# Patient Record
Sex: Male | Born: 1995 | Race: Black or African American | Hispanic: No | Marital: Single | Smoking: Former smoker
Health system: Southern US, Community
[De-identification: ages and names within clinical notes are randomized; demographics above are authoritative.]

---
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
## 2015-06-01 ENCOUNTER — Emergency Department (HOSPITAL_COMMUNITY)
Admission: EM | Admit: 2015-06-01 | Discharge: 2015-06-01 | Disposition: A | Discharge status: Home / Self Care | Payer: Self-pay | Attending: Emergency Medicine | Admitting: Emergency Medicine

## 2015-06-01 ENCOUNTER — Encounter (HOSPITAL_COMMUNITY): Payer: Self-pay | Admitting: Emergency Medicine

## 2015-06-01 DIAGNOSIS — L0390 Cellulitis, unspecified: Secondary | ICD-10-CM

## 2015-06-01 DIAGNOSIS — Z23 Encounter for immunization: Secondary | ICD-10-CM | POA: Insufficient documentation

## 2015-06-01 DIAGNOSIS — H6001 Abscess of right external ear: Secondary | ICD-10-CM | POA: Insufficient documentation

## 2015-06-01 DIAGNOSIS — H6011 Cellulitis of right external ear: Secondary | ICD-10-CM | POA: Insufficient documentation

## 2015-06-01 DIAGNOSIS — L919 Hypertrophic disorder of the skin, unspecified: Secondary | ICD-10-CM | POA: Insufficient documentation

## 2015-06-01 DIAGNOSIS — F17210 Nicotine dependence, cigarettes, uncomplicated: Secondary | ICD-10-CM | POA: Insufficient documentation

## 2015-06-01 DIAGNOSIS — L0291 Cutaneous abscess, unspecified: Secondary | ICD-10-CM

## 2015-06-01 MED ORDER — CEPHALEXIN 500 MG PO CAPS: 500.0000 mg | ORAL_CAPSULE | Freq: Four times a day (QID) | ORAL | Status: AC

## 2015-06-01 MED ORDER — SULFAMETHOXAZOLE-TRIMETHOPRIM 800-160 MG PO TABS: 1.0000 | ORAL_TABLET | Freq: Two times a day (BID) | ORAL | Status: AC

## 2015-06-01 MED ORDER — IBUPROFEN 200 MG PO TABS
600.0000 mg | ORAL_TABLET | Freq: Once | ORAL | Status: AC
  Administered 2015-06-01: 600 mg via ORAL
  Filled 2015-06-01: qty 1

## 2015-06-01 MED ORDER — IBUPROFEN 800 MG PO TABS: 800.0000 mg | ORAL_TABLET | Freq: Three times a day (TID) | ORAL | Status: AC | PRN

## 2015-06-01 MED ORDER — HYDROCODONE-ACETAMINOPHEN 5-325 MG PO TABS: 1.0000 | ORAL_TABLET | ORAL | Status: AC | PRN

## 2015-06-01 MED ORDER — TETANUS-DIPHTH-ACELL PERTUSSIS 5-2.5-18.5 LF-MCG/0.5 IM SUSP
0.5000 mL | Freq: Once | INTRAMUSCULAR | Status: AC
  Administered 2015-06-01: 0.5 mL via INTRAMUSCULAR
  Filled 2015-06-01: qty 0.5

## 2015-06-01 MED ORDER — LIDOCAINE HCL (PF) 1 % IJ SOLN
5.0000 mL | Freq: Once | INTRAMUSCULAR | Status: AC
  Administered 2015-06-01: 5 mL
  Filled 2015-06-01: qty 5

## 2015-06-01 NOTE — Discharge Instructions (Signed)
Read the information below.  Use the prescribed medication as directed.  Please discuss all new medications with your pharmacist.  Do not take additional tylenol while taking the prescribed pain medication to avoid overdose.  You may return to the Emergency Department at any time for worsening condition or any new symptoms that concern you.    If you develop increased redness, swelling, uncontrolled pain, or fevers greater than 100.4, return to the ER immediately for a recheck.     Incision and Drainage Incision and drainage is a procedure in which a sac-like structure (cystic structure) is opened and drained. The area to be drained usually contains material such as pus, fluid, or blood.  LET YOUR CAREGIVER KNOW ABOUT:   Allergies to medicine.  Medicines taken, including vitamins, herbs, eyedrops, over-the-counter medicines, and creams.  Use of steroids (by mouth or creams).  Previous problems with anesthetics or numbing medicines.  History of bleeding problems or blood clots.  Previous surgery.  Other health problems, including diabetes and kidney problems.  Possibility of pregnancy, if this applies. RISKS AND COMPLICATIONS  Pain.  Bleeding.  Scarring.  Infection. BEFORE THE PROCEDURE  You may need to have an ultrasound or other imaging tests to see how large or deep your cystic structure is. Blood tests may also be used to determine if you have an infection or how severe the infection is. You may need to have a tetanus shot. PROCEDURE  The affected area is cleaned with a cleaning fluid. The cyst area will then be numbed with a medicine (local anesthetic). A small incision will be made in the cystic structure. A syringe or catheter may be used to drain the contents of the cystic structure, or the contents may be squeezed out. The area will then be flushed with a cleansing solution. After cleansing the area, it is often gently packed with a gauze or another wound dressing. Once it  is packed, it will be covered with gauze and tape or some other type of wound dressing. AFTER THE PROCEDURE   Often, you will be allowed to go home right after the procedure.  You may be given antibiotic medicine to prevent or heal an infection.  If the area was packed with gauze or some other wound dressing, you will likely need to come back in 1 to 2 days to get it removed.  The area should heal in about 14 days.   This information is not intended to replace advice given to you by your health care provider. Make sure you discuss any questions you have with your health care provider.   Document Released: 07/28/2000 Document Revised: 08/03/2011 Document Reviewed: 03/29/2011 Elsevier Interactive Patient Education 2016 [employer]  Abscess An abscess is an infected area that contains a collection of pus and debris.It can occur in almost any part of the body. An abscess is also known as a furuncle or boil. CAUSES  An abscess occurs when tissue gets infected. This can occur from blockage of oil or sweat glands, infection of hair follicles, or a minor injury to the skin. As the body tries to fight the infection, pus collects in the area and creates pressure under the skin. This pressure causes pain. People with weakened immune systems have difficulty fighting infections and get certain abscesses more often.  SYMPTOMS Usually an abscess develops on the skin and becomes a painful mass that is red, warm, and tender. If the abscess forms under the skin, you may feel a moveable soft  area under the skin. Some abscesses break open (rupture) on their own, but most will continue to get worse without care. The infection can spread deeper into the body and eventually into the bloodstream, causing you to feel ill.  °DIAGNOSIS  °Your caregiver will take your medical history and perform a physical exam. A sample of fluid may also be taken from the abscess to determine what is causing your  infection. °TREATMENT  °Your caregiver may prescribe antibiotic medicines to fight the infection. However, taking antibiotics alone usually does not cure an abscess. Your caregiver may need to make a small cut (incision) in the abscess to drain the pus. In some cases, gauze is packed into the abscess to reduce pain and to continue draining the area. °HOME CARE INSTRUCTIONS  °· Only take over-the-counter or prescription medicines for pain, discomfort, or fever as directed by your caregiver. °· If you were prescribed antibiotics, take them as directed. Finish them even if you start to feel better. °· If gauze is used, follow your caregiver's directions for changing the gauze. °· To avoid spreading the infection: °· Keep your draining abscess covered with a bandage. °· Wash your hands well. °· Do not share personal care items, towels, or whirlpools with others. °· Avoid skin contact with others. °· Keep your skin and clothes clean around the abscess. °· Keep all follow-up appointments as directed by your caregiver. °SEEK MEDICAL CARE IF:  °· You have increased pain, swelling, redness, fluid drainage, or bleeding. °· You have muscle aches, chills, or a general ill feeling. °· You have a fever. °MAKE SURE YOU:  °· Understand these instructions. °· Will watch your condition. °· Will get help right away if you are not doing well or get worse. °  °This information is not intended to replace advice given to you by your health care provider. Make sure you discuss any questions you have with your health care provider. °  °Document Released: 11/11/2004 Document Revised: 08/03/2011 Document Reviewed: 04/16/2011 °Elsevier Interactive Patient Education ©2016 Elsevier Inc. ° °Cellulitis °Cellulitis is an infection of the skin and the tissue beneath it. The infected area is usually red and tender. Cellulitis occurs most often in the arms and lower legs.  °CAUSES  °Cellulitis is caused by bacteria that enter the skin through cracks  or cuts in the skin. The most common types of bacteria that cause cellulitis are staphylococci and streptococci. °SIGNS AND SYMPTOMS  °· Redness and warmth. °· Swelling. °· Tenderness or pain. °· Fever. °DIAGNOSIS  °Your health care provider can usually determine what is wrong based on a physical exam. Blood tests may also be done. °TREATMENT  °Treatment usually involves taking an antibiotic medicine. °HOME CARE INSTRUCTIONS  °· Take your antibiotic medicine as directed by your health care provider. Finish the antibiotic even if you start to feel better. °· Keep the infected arm or leg elevated to reduce swelling. °· Apply a warm cloth to the affected area up to 4 times per day to relieve pain. °· Take medicines only as directed by your health care provider. °· Keep all follow-up visits as directed by your health care provider. °SEEK MEDICAL CARE IF:  °· You notice red streaks coming from the infected area. °· Your red area gets larger or turns dark in color. °· Your bone or joint underneath the infected area becomes painful after the skin has healed. °· Your infection returns in the same area or another area. °· You notice a swollen bump   in the infected area.  You develop new symptoms.  You have a fever. SEEK IMMEDIATE MEDICAL CARE IF:   You feel very sleepy.  You develop vomiting or diarrhea.  You have a general ill feeling (malaise) with muscle aches and pains.   This information is not intended to replace advice given to you by your health care provider. Make sure you discuss any questions you have with your health care provider.   Document Released: 11/11/2004 Document Revised: 10/23/2014 Document Reviewed: 04/19/2011 Elsevier Interactive Patient Education [employer]2016 Elsevier Inc.

## 2015-06-01 NOTE — ED Notes (Addendum)
Pt states he wore "fake earrings" 3 days ago and now has swelling and pain to R ear lobe.

## 2015-06-01 NOTE — ED Provider Notes (Signed)
CSN: [id number]     Arrival date & time 06/01/15  2128 History   First MD Initiated Contact with Patient 06/01/15 2144     Chief Complaint  Patient presents with  . Ear Pain     (Consider location/radiation/quality/duration/timing/severity/associated sxs/prior Treatment) The history is provided by the patient.     Patient presents with right earlobe pain.  States he put some earrings in that did not feel good, took them out a few hours later.  He then developed pain, swelling.  Pain is constant, worse with palpation.  He notes he does often have reaction to certain types of metal earrings.  Denies fevers, chills, no discharge from the earlobe.     History reviewed. No pertinent past medical history. History reviewed. No pertinent past surgical history. No family history on file. Social History  Substance Use Topics  . Smoking status: Current Every Day Smoker    Types: Cigars  . Smokeless tobacco: None  . Alcohol Use: No    Review of Systems  Constitutional: Negative for fever and chills.  HENT: Positive for ear pain. Negative for ear discharge and facial swelling.   Skin: Negative for rash.  Allergic/Immunologic: Negative for immunocompromised state.  Psychiatric/Behavioral: Negative for self-injury.      Allergies  Review of patient's allergies indicates not on file.  Home Medications   Prior to Admission medications   Not on File   BP 140/74 mmHg  Pulse 47  Temp(Src) 98.6 F (37 C) (Oral)  Resp 16  Ht 5\' 11"  (1.803 m)  Wt 78.291 kg  BMI 24.08 kg/m2  SpO2 100% Physical Exam  Constitutional: He appears well-developed and well-nourished. No distress.  HENT:  Head: Normocephalic and atraumatic.  Ears are pierced.  Bilateral hypertrophy of earlobes.  Right earlobe is tender to palpation, small amount over overlying erythema.    Neck: Neck supple.  Pulmonary/Chest: Effort normal.  Neurological: He is alert.  Skin: He is not diaphoretic.  Nursing note and  vitals reviewed.   ED Course  Procedures (including critical care time) Labs Review Labs Reviewed - No data to display  Imaging Review No results found. I have personally reviewed and evaluated these images and lab results as part of my medical decision-making.   EKG Interpretation None       INCISION AND DRAINAGE Performed by: Trixie DredgeWEST, [place] Consent: Verbal consent obtained. Risks and benefits: risks, benefits and alternatives were discussed Type: abscess  Body area: right earlobe  Anesthesia: local infiltration  Incision was made with a scalpel.  Local anesthetic: lidocaine 2% no epinephrine  Anesthetic total: 3 ml  Complexity: complex Blunt dissection to break up loculations  Drainage: purulent  Drainage amount: large  Packing material: none  Patient tolerance: Patient tolerated the procedure well with no immediate complications.     MDM   Final diagnoses:  Abscess and cellulitis    Afebrile, nontoxic patient with infected right earlobe. Bedside ultrasound appeared to show small amount of fluid collection.  I&D with moderate to large amount of purulent drainage.   D/C home with warm compress/warm soak instructions, bactrim/keflex, pain medications, 2 day recheck, return precautions.  Discussed result, findings, treatment, and follow up  with patient.  Pt given return precautions.  Pt verbalizes understanding and agrees with plan.         Trixie Dredgemily Odeth Bry, PA-C 06/01/15 2254  Alvira MondayErin Schlossman, MD 06/03/15 [phone]

## 2015-06-01 NOTE — ED Notes (Signed)
Pt stable, ambulatory, states understanding of discharge instructions 

## 2016-07-09 ENCOUNTER — Encounter (HOSPITAL_COMMUNITY): Payer: Self-pay | Admitting: Emergency Medicine

## 2016-07-09 ENCOUNTER — Emergency Department (HOSPITAL_COMMUNITY)
Admission: EM | Admit: 2016-07-09 | Discharge: 2016-07-09 | Disposition: A | Discharge status: Home / Self Care | Payer: Self-pay | Attending: Emergency Medicine | Admitting: Emergency Medicine

## 2016-07-09 DIAGNOSIS — Z87891 Personal history of nicotine dependence: Secondary | ICD-10-CM | POA: Insufficient documentation

## 2016-07-09 DIAGNOSIS — L0231 Cutaneous abscess of buttock: Secondary | ICD-10-CM | POA: Insufficient documentation

## 2016-07-09 MED ORDER — IBUPROFEN 800 MG PO TABS
800.0000 mg | ORAL_TABLET | Freq: Once | ORAL | Status: AC
  Administered 2016-07-09: 800 mg via ORAL
  Filled 2016-07-09: qty 1

## 2016-07-09 MED ORDER — LIDOCAINE-EPINEPHRINE (PF) 2 %-1:200000 IJ SOLN
1.7000 mL | Freq: Once | INTRAMUSCULAR | Status: AC
  Administered 2016-07-09: 1.7 mL
  Filled 2016-07-09: qty 20

## 2016-07-09 MED ORDER — SULFAMETHOXAZOLE-TRIMETHOPRIM 800-160 MG PO TABS: 1.0000 | ORAL_TABLET | Freq: Two times a day (BID) | ORAL | 0 refills | Status: AC

## 2016-07-09 NOTE — ED Triage Notes (Signed)
Pt reports to having abscess to right posterior buttocks that started over a week ago.

## 2016-07-09 NOTE — ED Provider Notes (Signed)
[facility]-EMERGENCY DEPT Provider Note   CSN: [id number] Arrival date & time: 07/09/16  [id number]     History   Chief Complaint Chief Complaint  Patient presents with  . Abscess    HPI Hector Hunter is a 21 y.o. male who presents to the Emergency Department with an abscess to the right buttock. He [place] [facility] [place] the [place] days, and began developing an abscess one week ago that has continued to worsen. He reports a h/o of previous abscesses that have resolved without intervention. No associated fever or chills. He reports that the swelling and pain has made it painful to walk. Rates the pain as 7/10. Denies active drainage. He reports he was given ibuprofen a couple of days ago, but no other treatment. NKA. No chronic medical conditions including DM. No IVDU.    The history is provided by the patient. No language interpreter was used.    History reviewed. No pertinent past medical history.  There are no active problems to display for this patient.   History reviewed. No pertinent surgical history.     Home Medications    Prior to Admission medications   Medication Sig Start Date End Date Taking? Authorizing Provider  cephALEXin (KEFLEX) 500 MG capsule Take 1 capsule (500 mg total) by mouth 4 (four) times daily. 06/01/15   Trixie DredgeWest, Emily, PA-C  HYDROcodone-acetaminophen (NORCO/VICODIN) 5-325 MG tablet Take 1-2 tablets by mouth every 4 (four) hours as needed for moderate pain or severe pain. 06/01/15   Trixie DredgeWest, Emily, PA-C  ibuprofen (ADVIL,MOTRIN) 800 MG tablet Take 1 tablet (800 mg total) by mouth every 8 (eight) hours as needed for mild pain or moderate pain. 06/01/15   Trixie DredgeWest, Emily, PA-C    Family History History reviewed. No pertinent family history.  Social History Social History  Substance Use Topics  . Smoking status: Former Smoker    Types: Cigars    Quit date: 05/16/2016  . Smokeless tobacco: Never Used  . Alcohol use No     Allergies   Patient  has no known allergies.   Review of Systems Review of Systems  Constitutional: Negative for activity change.  Respiratory: Negative for shortness of breath.   Cardiovascular: Negative for chest pain.  Gastrointestinal: Negative for abdominal pain.  Musculoskeletal: Negative for back pain.  Skin: Negative for rash.       Abscess     Physical Exam Updated Vital Signs BP (!) 133/92 (BP Location: Left Arm)   Pulse 87   Temp 98.6 F (37 C) (Oral)   Resp 20   Ht 5\' 6"  (1.676 m)   Wt 62.1 kg (137 lb)   SpO2 100%   BMI 22.11 kg/m   Physical Exam  Constitutional: He appears well-developed and well-nourished.  HENT:  Head: Normocephalic and atraumatic.  Eyes: Conjunctivae are normal.  Neck: Neck supple.  Cardiovascular: Normal rate and regular rhythm.   No murmur heard. Pulmonary/Chest: Effort normal and breath sounds normal. No respiratory distress.  Abdominal: Soft. There is no tenderness.  Musculoskeletal: He exhibits no edema.  Neurological: He is alert.  Skin: Skin is warm and dry.  There is a large area of warmth and swelling with erythema and fluctuance that extends over >1/3 of the medial left buttock. No rectal involvement. No streaking. Moderate erythema. No active drainage.   Psychiatric: He has a normal mood and affect.  Nursing note and vitals reviewed.    ED Treatments / Results  Labs (  all labs ordered are listed, but only abnormal results are displayed) Labs Reviewed - No data to display  EKG  EKG Interpretation None       Radiology No results found.  Procedures  EMERGENCY DEPARTMENT US SOFT TISSUE INTERPRETATION "Study: Limited Soft Tissue Ultrasound"  INDICATIONS: Soft tissue infection Multiple views of the body part were obtained in real-time with a multi-frequency linear probe  PERFORMED BY: Myself IMAGES ARCHIVED?: Yes SIDE:Right  BODY PART:Right buttock INTERPRETATION:  Abcess present    .Marland KitchenIncision and Drainage Date/Time:  07/09/2016 8:02 AM Performed by: Lilian Kapur, Earlie Schank A Authorized by: Frederik Pear A   Consent:    Consent obtained:  Verbal   Consent given by:  Patient   Risks discussed:  Bleeding, incomplete drainage, pain and infection   Alternatives discussed:  No treatment Location:    Type:  Abscess   Size:  3x3 cm    Location: right buttock. Pre-procedure details:    Skin preparation:  Betadine Anesthesia (see MAR for exact dosages):    Anesthesia method:  Local infiltration   Local anesthetic:  Lidocaine 1% WITH epi Procedure type:    Complexity:  Simple Procedure details:    Incision types:  Stab incision   Incision depth:  Dermal   Scalpel blade:  10   Wound management:  Probed and deloculated   Drainage:  Bloody and purulent   Drainage amount:  Copious   Wound treatment:  Wound left open   Packing materials:  None Post-procedure details:    Patient tolerance of procedure:  Tolerated well, no immediate complications   (including critical care time)  Medications Ordered in ED Medications  ibuprofen (ADVIL,MOTRIN) tablet 800 mg (not administered)  lidocaine-EPINEPHrine (XYLOCAINE W/EPI) 2 %-1:200000 (PF) injection 1.7 mL (not administered)     Initial Impression / Assessment and Plan / ED Course  I have reviewed the triage vital signs and the nursing notes.  Pertinent labs & imaging results that were available during my care of the patient were reviewed by me and considered in my medical decision making (see chart for details).     Patient with skin abscess amenable to incision and drainage. Discussed the patient with Dr. Blinda Leatherwood, attending physician who agrees with the treatment and plan. Wound culture pending. Due to patient's recent incarceration and recurrent abscesses put him at increased risk for MRSA, will d/c home with Bactrim and wound re-check in 3 days if symptoms worsen. VSS. NAD.    Final Clinical Impressions(s) / ED Diagnoses   Final diagnoses:  None    New  Prescriptions New Prescriptions   No medications on file     Barkley Boards, PA-C 07/09/16 1646    Gilda Crease, MD 07/10/16 [phone]

## 2016-07-09 NOTE — ED Notes (Signed)
Patient is alert and oriented x3.  He was given DC instructions and follow up visit instructions.  Patient gave verbal understanding.  He was DC ambulatory under his own power to home.  V/S stable.  He was not showing any signs of distress on DC 

## 2016-07-09 NOTE — Discharge Instructions (Signed)
You have been diagnosed with an abscess today. An incision and drainage was performed. Please keep a dressing over the wound until the area closes. It will continue to drain, this is normal. Please keep the wound clean with warm soap and water. You have been prescribed Bactrim, an antibiotic. Please take Bactrim twice per day for the next 7 days. The area should continue to improve over the next few days. A wound culture has been sent. They will call you if you need change antibiotics. If the area continues to get bigger, more red, and painful or if you develop fever and chills, please return to the Emergency Department or [facility] for [facility]. Please take all of your antibiotics even if you start to feel better.

## 2016-07-11 LAB — AEROBIC CULTURE  (SUPERFICIAL SPECIMEN)

## 2016-07-11 LAB — AEROBIC CULTURE W GRAM STAIN (SUPERFICIAL SPECIMEN)
Culture: NO GROWTH
Special Requests: NORMAL

## 2018-07-06 ENCOUNTER — Encounter (HOSPITAL_COMMUNITY): Payer: Self-pay

## 2018-07-06 ENCOUNTER — Emergency Department (HOSPITAL_COMMUNITY)
Admission: EM | Admit: 2018-07-06 | Discharge: 2018-07-06 | Disposition: A | Discharge status: Home / Self Care | Payer: Self-pay | Attending: Emergency Medicine | Admitting: Emergency Medicine

## 2018-07-06 ENCOUNTER — Other Ambulatory Visit: Payer: Self-pay

## 2018-07-06 DIAGNOSIS — Z87891 Personal history of nicotine dependence: Secondary | ICD-10-CM | POA: Insufficient documentation

## 2018-07-06 DIAGNOSIS — H6122 Impacted cerumen, left ear: Secondary | ICD-10-CM | POA: Insufficient documentation

## 2018-07-06 DIAGNOSIS — F1210 Cannabis abuse, uncomplicated: Secondary | ICD-10-CM | POA: Insufficient documentation

## 2018-07-06 DIAGNOSIS — R599 Enlarged lymph nodes, unspecified: Secondary | ICD-10-CM | POA: Insufficient documentation

## 2018-07-06 MED ORDER — AMOXICILLIN 500 MG PO CAPS: 500.0000 mg | ORAL_CAPSULE | Freq: Three times a day (TID) | ORAL | 0 refills | Status: AC

## 2018-07-06 MED ORDER — AMOXICILLIN 500 MG PO CAPS
500.0000 mg | ORAL_CAPSULE | Freq: Once | ORAL | Status: AC
  Administered 2018-07-06: 500 mg via ORAL
  Filled 2018-07-06: qty 1

## 2018-07-06 NOTE — ED Provider Notes (Signed)
[facility]-EMERGENCY DEPT Provider Note   CSN: [id number] Arrival date & time: 07/06/18  2253    History   Chief Complaint Chief Complaint  Patient presents with  . Otalgia    HPI Hector Hunter is a 23 y.o. male.     The history is provided by the patient.  Otalgia  Location:  Left Behind ear:  No abnormality Quality:  Aching Severity:  Moderate Onset quality:  Gradual Timing:  Constant Progression:  Waxing and waning Chronicity:  Chronic Context: not direct blow   Relieved by:  Nothing Worsened by:  Nothing Ineffective treatments:  None tried Associated symptoms: no abdominal pain, no fever, no neck pain, no rhinorrhea, no sore throat and no tinnitus   Risk factors: no recent travel   Patient reports bump in front of the left ear for some time and ear being stopped up.    History reviewed. No pertinent past medical history.  There are no active problems to display for this patient.   History reviewed. No pertinent surgical history.      Home Medications    Prior to Admission medications   Medication Sig Start Date End Date Taking? Authorizing Provider  cephALEXin (KEFLEX) 500 MG capsule Take 1 capsule (500 mg total) by mouth 4 (four) times daily. 06/01/15   Trixie Dredge, PA-C  HYDROcodone-acetaminophen (NORCO/VICODIN) 5-325 MG tablet Take 1-2 tablets by mouth every 4 (four) hours as needed for moderate pain or severe pain. 06/01/15   Trixie Dredge, PA-C  ibuprofen (ADVIL,MOTRIN) 800 MG tablet Take 1 tablet (800 mg total) by mouth every 8 (eight) hours as needed for mild pain or moderate pain. 06/01/15   Trixie Dredge, PA-C    Family History History reviewed. No pertinent family history.  Social History Social History   Tobacco Use  . Smoking status: Former Smoker    Types: Cigars    Last attempt to quit: 05/16/2016    Years since quitting: 2.1  . Smokeless tobacco: Never Used  Substance Use Topics  . Alcohol use: No  . Drug use: Yes     Types: Marijuana     Allergies   Patient has no known allergies.   Review of Systems Review of Systems  Constitutional: Negative for fever.  HENT: Positive for ear pain. Negative for rhinorrhea, sore throat and tinnitus.   Respiratory: Negative for shortness of breath.   Cardiovascular: Negative for chest pain.  Gastrointestinal: Negative for abdominal pain.  Musculoskeletal: Negative for neck pain.  All other systems reviewed and are negative.    Physical Exam Updated Vital Signs BP (!) 142/92 (BP Location: Left Arm)   Pulse 62   Temp 98.5 F (36.9 C) (Oral)   Resp 18   SpO2 100%   Physical Exam Vitals signs and nursing note reviewed.  Constitutional:      Appearance: He is normal weight.  HENT:     Head: Normocephalic and atraumatic.     Right Ear: Ear canal normal.     Left Ear: Ear canal normal.     Ears:     Comments: Small, isolated, non matted .9 cm LAN anterior to the left ear.      Nose: Nose normal.  Eyes:     Conjunctiva/sclera: Conjunctivae normal.     Pupils: Pupils are equal, round, and reactive to light.  Neck:     Musculoskeletal: Normal range of motion.  Cardiovascular:     Rate and Rhythm: Normal rate and regular rhythm.  Pulses: Normal pulses.     Heart sounds: Normal heart sounds.  Pulmonary:     Effort: Pulmonary effort is normal.     Breath sounds: Normal breath sounds.  Abdominal:     General: Abdomen is flat. Bowel sounds are normal.     Tenderness: There is no abdominal tenderness. There is no guarding.  Musculoskeletal: Normal range of motion.  Lymphadenopathy:     Cervical: No cervical adenopathy.  Skin:    General: Skin is warm and dry.     Capillary Refill: Capillary refill takes less than 2 seconds.  Neurological:     General: No focal deficit present.     Mental Status: He is alert and oriented to person, place, and time.  Psychiatric:        Mood and Affect: Mood normal.        Behavior: Behavior normal.       ED Treatments / Results  Labs (all labs ordered are listed, but only abnormal results are displayed) Labs Reviewed - No data to display  EKG None  Radiology No results found.  Procedures Procedures (including critical care time)  Medications Ordered in ED Medications  amoxicillin (AMOXIL) capsule 500 mg (has no administration in time range)     L canal was irrigated TM is normal,  Will start amoxicillin and refer to ENT,  Warm compresses  Final Clinical Impressions(s) / ED Diagnoses   Final diagnoses:  Reactive lymphadenopathy  Impacted cerumen of left ear     Return for intractable cough, coughing up blood,fevers >100.4 unrelieved by medication, shortness of breath, intractable vomiting, chest pain, shortness of breath, weakness,numbness, changes in speech, facial asymmetry,abdominal pain, passing out,Inability to tolerate liquids or food, cough, altered mental status or any concerns. No signs of systemic illness or infection. The patient is nontoxic-appearing on exam and vital signs are within normal limits.   I have reviewed the triage vital signs and the nursing notes. Pertinent labs &imaging results that were available during my care of the patient were reviewed by me and considered in my medical decision making (see chart for details).  After history, exam, and medical workup I feel the patient has been appropriately medically screened and is safe for discharge home. Pertinent diagnoses were discussed with the patient. Patient was given return precautions   Azadeh Hyder, MD 07/06/18 2338

## 2018-07-06 NOTE — ED Notes (Signed)
Bed: [facility]1 Expected date:  Expected time:  Means of arrival:  Comments: 

## 2018-07-06 NOTE — ED Triage Notes (Signed)
Pt reports L sided otalgia and a swollen, sore spot around the tragus of his L ear. States that a similar thing has happened in the past on the other side. Reports that sounds are muffled.

## 2019-07-19 ENCOUNTER — Emergency Department (HOSPITAL_COMMUNITY)
Admission: EM | Admit: 2019-07-19 | Discharge: 2019-07-19 | Disposition: A | Discharge status: Home / Self Care | Payer: Self-pay | Attending: Emergency Medicine | Admitting: Emergency Medicine

## 2019-07-19 ENCOUNTER — Encounter (HOSPITAL_COMMUNITY): Payer: Self-pay

## 2019-07-19 DIAGNOSIS — Z87891 Personal history of nicotine dependence: Secondary | ICD-10-CM | POA: Insufficient documentation

## 2019-07-19 DIAGNOSIS — Z711 Person with feared health complaint in whom no diagnosis is made: Secondary | ICD-10-CM | POA: Insufficient documentation

## 2019-07-19 DIAGNOSIS — F1210 Cannabis abuse, uncomplicated: Secondary | ICD-10-CM | POA: Insufficient documentation

## 2019-07-19 NOTE — Discharge Instructions (Signed)
You have been tested for chlamydia and gonorrhea.  These results will be available in approximately 3 days and you will be contacted by the hospital if the results are positive. Avoid sexual contact until you are aware of the results, and please inform all sexual partners if you test positive for any of these diseases.  Please follow up with your primary care provider within 5-7 days for re-evaluation of your symptoms. If you do not have a primary care provider, information for a healthcare clinic has been provided for you to make arrangements for follow up care. Please return to the emergency department for any new or worsening symptoms.

## 2019-07-19 NOTE — ED Provider Notes (Signed)
[facility] EMERGENCY DEPARTMENT Provider Note   CSN: [id number] Arrival date & time: 07/19/19  1005     History Chief Complaint  Patient presents with  . Exposure to STD    Hector Hunter is a 24 y.o. male.  HPI   24 year old male presenting for evaluation of STD exposure.  States that his sexual partner tested positive for herpes and he would like to be tested for this.  He does not have any lesions or pain to the penis.  Denies any penile discharge, dysuria or other STI symptoms.  States he did test positive for an STD earlier this year and at that time he was also tested for HIV and syphilis.  States he was appropriately treated for this.  History reviewed. No pertinent past medical history.  There are no problems to display for this patient.   History reviewed. No pertinent surgical history.     No family history on file.  Social History   Tobacco Use  . Smoking status: Former Smoker    Types: Cigars    Quit date: 05/16/2016    Years since quitting: 3.1  . Smokeless tobacco: Never Used  Substance Use Topics  . Alcohol use: No  . Drug use: Yes    Types: Marijuana    Home Medications Prior to Admission medications   Medication Sig Start Date End Date Taking? Authorizing Provider  amoxicillin (AMOXIL) 500 MG capsule Take 1 capsule (500 mg total) by mouth 3 (three) times daily. 07/06/18   Palumbo, April, MD  cephALEXin (KEFLEX) 500 MG capsule Take 1 capsule (500 mg total) by mouth 4 (four) times daily. 06/01/15   Trixie Dredge, PA-C  HYDROcodone-acetaminophen (NORCO/VICODIN) 5-325 MG tablet Take 1-2 tablets by mouth every 4 (four) hours as needed for moderate pain or severe pain. 06/01/15   Trixie Dredge, PA-C  ibuprofen (ADVIL,MOTRIN) 800 MG tablet Take 1 tablet (800 mg total) by mouth every 8 (eight) hours as needed for mild pain or moderate pain. 06/01/15   Trixie Dredge, PA-C    Allergies    Patient has no known allergies.  Review of Systems     Review of Systems  Constitutional: Negative for fever.  Genitourinary: Negative for discharge, dysuria, frequency, genital sores, hematuria, penile pain and urgency.  Musculoskeletal: Negative for back pain.    Physical Exam Updated Vital Signs BP 140/70   Pulse 60   Temp 98.8 F (37.1 C) (Oral)   Resp 14   Ht 5\' 8"  (1.727 m)   Wt 63.5 kg   SpO2 100%   BMI 21.29 kg/m   Physical Exam Constitutional:      General: He is not in acute distress.    Appearance: He is well-developed.  Eyes:     Conjunctiva/sclera: Conjunctivae normal.  Cardiovascular:     Rate and Rhythm: Normal rate.  Pulmonary:     Effort: Pulmonary effort is normal.  Genitourinary:    Comments: Deferred as pt is asymptomatic Skin:    General: Skin is warm and dry.  Neurological:     Mental Status: He is alert and oriented to person, place, and time.     ED Results / Procedures / Treatments   Labs (all labs ordered are listed, but only abnormal results are displayed) Labs Reviewed  GC/CHLAMYDIA PROBE AMP ([place]) NOT AT [facility]    EKG None  Radiology No results found.  Procedures Procedures (including critical care time)  Medications Ordered in ED Medications - No  data to display  ED Course  I have reviewed the triage vital signs and the nursing notes.  Pertinent labs & imaging results that were available during my care of the patient were reviewed by me and considered in my medical decision making (see chart for details).    MDM Rules/Calculators/A&P                     Patient is afebrile without abdominal pain or painful bowel movements to indicate prostatitis.  No pain of the testes or epididymis to suggest orchitis or epididymitis.  STD cultures obtained including  gonorrhea and chlamydia obtained. Pt declined HIV/RPR as he was just tested for this. Advised that if he does not have any lesions to the penis I cannot reliably test him for herpes. Patient to be discharged with  instructions to follow up with PCP. Discussed importance of using protection when sexually active. Pt understands that they have GC/Chlamydia cultures pending and that they will need to inform all sexual partners if results return positive.   Final Clinical Impression(s) / ED Diagnoses Final diagnoses:  Concern about STD in male without diagnosis    Rx / DC Orders ED Discharge Orders    None       Bishop Dublin 07/19/19 1333    Davonna Belling, MD 07/20/19 [phone]

## 2019-07-19 NOTE — ED Notes (Signed)
Pt states he was tested for HIV in March and would like to wait on blood draw. PA made aware

## 2019-07-19 NOTE — ED Triage Notes (Signed)
Pt reports his partner tested positive for herpes so he would like to be tested.

## 2019-07-20 LAB — GC/CHLAMYDIA PROBE AMP (~~LOC~~) NOT AT ARMC
Chlamydia: NEGATIVE
Comment: NEGATIVE
Comment: NORMAL
Neisseria Gonorrhea: NEGATIVE

## 2021-12-25 ENCOUNTER — Encounter (HOSPITAL_COMMUNITY): Payer: Self-pay

## 2021-12-25 ENCOUNTER — Other Ambulatory Visit: Payer: Self-pay

## 2021-12-25 ENCOUNTER — Emergency Department (HOSPITAL_COMMUNITY)
Admission: EM | Admit: 2021-12-25 | Discharge: 2021-12-26 | Discharge status: Against Medical Advice | Payer: Self-pay | Attending: Emergency Medicine | Admitting: Emergency Medicine

## 2021-12-25 DIAGNOSIS — Z5321 Procedure and treatment not carried out due to patient leaving prior to being seen by health care provider: Secondary | ICD-10-CM | POA: Insufficient documentation

## 2021-12-25 DIAGNOSIS — Y93G9 Activity, other involving cooking and grilling: Secondary | ICD-10-CM | POA: Insufficient documentation

## 2021-12-25 DIAGNOSIS — T2121XA Burn of second degree of chest wall, initial encounter: Secondary | ICD-10-CM | POA: Insufficient documentation

## 2021-12-25 DIAGNOSIS — X118XXA Contact with other hot tap-water, initial encounter: Secondary | ICD-10-CM | POA: Insufficient documentation

## 2021-12-25 DIAGNOSIS — T2122XA Burn of second degree of abdominal wall, initial encounter: Secondary | ICD-10-CM | POA: Insufficient documentation

## 2021-12-25 MED ORDER — OXYCODONE HCL 5 MG PO TABS
5.0000 mg | ORAL_TABLET | Freq: Once | ORAL | Status: AC
  Administered 2021-12-25: 5 mg via ORAL
  Filled 2021-12-25: qty 1
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
